# Patient Record
Sex: Female | Born: 2005 | Race: White | Hispanic: No | Marital: Single | Smoking: Never smoker
Health system: Southern US, Community
[De-identification: ages and names within clinical notes are randomized; demographics above are authoritative.]

## PROBLEM LIST (undated history)

## (undated) DIAGNOSIS — I10 Essential (primary) hypertension: Secondary | ICD-10-CM

## (undated) DIAGNOSIS — E669 Obesity, unspecified: Secondary | ICD-10-CM

## (undated) DIAGNOSIS — T7840XA Allergy, unspecified, initial encounter: Secondary | ICD-10-CM

## (undated) HISTORY — PX: NO PAST SURGERIES: SHX2092

## (undated) HISTORY — DX: Allergy, unspecified, initial encounter: T78.40XA

## (undated) HISTORY — DX: Obesity, unspecified: E66.9

## (undated) HISTORY — PX: WISDOM TOOTH EXTRACTION: SHX21

---
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
## 2023-02-07 ENCOUNTER — Telehealth: Payer: Self-pay

## 2023-02-07 NOTE — Telephone Encounter (Signed)
LVM, also sending mychart msg. AS, CMA 

## 2023-05-23 ENCOUNTER — Encounter: Payer: Self-pay | Admitting: *Deleted

## 2023-05-31 ENCOUNTER — Emergency Department (HOSPITAL_COMMUNITY)
Admission: EM | Admit: 2023-05-31 | Discharge: 2023-05-31 | Disposition: A | Discharge status: Home / Self Care | Payer: Medicaid Other | Attending: Emergency Medicine | Admitting: Emergency Medicine

## 2023-05-31 ENCOUNTER — Other Ambulatory Visit: Payer: Self-pay

## 2023-05-31 ENCOUNTER — Emergency Department (HOSPITAL_COMMUNITY): Payer: Medicaid Other

## 2023-05-31 ENCOUNTER — Encounter (HOSPITAL_COMMUNITY): Payer: Self-pay

## 2023-05-31 DIAGNOSIS — Z79899 Other long term (current) drug therapy: Secondary | ICD-10-CM | POA: Insufficient documentation

## 2023-05-31 DIAGNOSIS — N3000 Acute cystitis without hematuria: Secondary | ICD-10-CM | POA: Insufficient documentation

## 2023-05-31 DIAGNOSIS — R1031 Right lower quadrant pain: Secondary | ICD-10-CM | POA: Diagnosis present

## 2023-05-31 LAB — URINALYSIS, ROUTINE W REFLEX MICROSCOPIC
Bilirubin Urine: NEGATIVE
Glucose, UA: NEGATIVE mg/dL
Hgb urine dipstick: NEGATIVE
Ketones, ur: NEGATIVE mg/dL
Nitrite: NEGATIVE
Protein, ur: NEGATIVE mg/dL
Specific Gravity, Urine: 1.023 (ref 1.005–1.030)
pH: 5 (ref 5.0–8.0)

## 2023-05-31 LAB — CBC
HCT: 43.7 % (ref 36.0–46.0)
Hemoglobin: 14.1 g/dL (ref 12.0–15.0)
MCH: 27.1 pg (ref 26.0–34.0)
MCHC: 32.3 g/dL (ref 30.0–36.0)
MCV: 83.9 fL (ref 80.0–100.0)
Platelets: 347 10*3/uL (ref 150–400)
RBC: 5.21 MIL/uL — ABNORMAL HIGH (ref 3.87–5.11)
RDW: 13.9 % (ref 11.5–15.5)
WBC: 9.2 10*3/uL (ref 4.0–10.5)
nRBC: 0 % (ref 0.0–0.2)

## 2023-05-31 LAB — COMPREHENSIVE METABOLIC PANEL
ALT: 28 U/L (ref 0–44)
AST: 23 U/L (ref 15–41)
Albumin: 3.9 g/dL (ref 3.5–5.0)
Alkaline Phosphatase: 67 U/L (ref 38–126)
Anion gap: 12 (ref 5–15)
BUN: 12 mg/dL (ref 6–20)
CO2: 22 mmol/L (ref 22–32)
Calcium: 9 mg/dL (ref 8.9–10.3)
Chloride: 104 mmol/L (ref 98–111)
Creatinine, Ser: 0.79 mg/dL (ref 0.44–1.00)
GFR, Estimated: >60 mL/min (ref >60)
Glucose, Bld: 94 mg/dL (ref 70–99)
Potassium: 3.8 mmol/L (ref 3.5–5.1)
Sodium: 138 mmol/L (ref 135–145)
Total Bilirubin: 0.6 mg/dL (ref 0.3–1.2)
Total Protein: 7.6 g/dL (ref 6.5–8.1)

## 2023-05-31 LAB — LIPASE, BLOOD: Lipase: 27 U/L (ref 11–51)

## 2023-05-31 LAB — PREGNANCY, URINE: Preg Test, Ur: NEGATIVE

## 2023-05-31 MED ORDER — CEPHALEXIN 500 MG PO CAPS
500.0000 mg | ORAL_CAPSULE | Freq: Four times a day (QID) | ORAL | 0 refills | Status: AC
Start: 2023-05-31 — End: 2023-06-07

## 2023-05-31 MED ORDER — CEPHALEXIN 500 MG PO CAPS
500.0000 mg | ORAL_CAPSULE | Freq: Once | ORAL | Status: AC
  Administered 2023-05-31: 500 mg via ORAL
  Filled 2023-05-31: qty 1

## 2023-05-31 NOTE — ED Provider Triage Note (Signed)
Emergency Medicine Provider Triage Evaluation Note  Hayley Blankenship , a 17 y.o. female  was evaluated in triage.  Pt complains of lower quadrant abdominal pain and low back pain, has been being treated for UTI without success by PCP.  Also complains of a week and a half of sharp left upper chest pain, recently started phentermine this with the pain seemed to start..  Review of Systems  Positive: Abdominal pain, low back pain Negative: Fever, vomiting  Physical Exam  BP 134/73 (BP Location: Right Arm)   Pulse 96   Temp 98.7 F (37.1 C) (Oral)   Resp 18   Ht 6' (1.829 m)   Wt (!) 152 kg   LMP 05/08/2023 (Exact Date)   SpO2 95%   BMI 45.43 kg/m  Gen:   Awake, no distress   Resp:  Normal effort  MSK:   Moves extremities without difficulty  Other:    Medical Decision Making  Medically screening exam initiated at 3:38 PM.  Appropriate orders placed.  Hayley Blankenship was informed that the remainder of the evaluation will be completed by another provider, this initial triage assessment does not replace that evaluation, and the importance of remaining in the ED until their evaluation is complete.     Ma Rings, [place] 05/31/23 1539

## 2023-05-31 NOTE — ED Triage Notes (Signed)
Pt states having a UTI for over 2 months and seeing her PCP for it with no relief. Pt has been put on nitrofurantoin monohydrate/microcrystal capsule 2 times daily, and bactrim 800 mg with no relief. Pt states lower abdominal cramping that radiates to primarily to lt side.

## 2023-05-31 NOTE — ED Provider Notes (Signed)
 [place] [facility] AT [facility] Provider Note   CSN: [id number] Arrival date & time: 05/31/23  1430     History Chief Complaint  Patient presents with   Abdominal Pain    Hayley Blankenship is a 17 y.o. female.  Patient presents the emergency department concerns of abdominal pain.  Reports that she has been diagnosed with a UTI that has been difficult to clear over the last 2 months.  Patient has been on 2 separate rounds of antibiotics without significant improvement.  States that she is feeling lower abdominal cramping and pain with some radiation to the left side.  Denies any vaginal discharge or concerns about sexually transmitted infections.  No prior history of kidney stones.   Abdominal Pain      Home Medications Prior to Admission medications   Medication Sig Start Date End Date Taking? Authorizing Provider  cephALEXin (KEFLEX) 500 MG capsule Take 1 capsule (500 mg total) by mouth 4 (four) times daily for 7 days. 05/31/23 06/07/23 Yes Smitty Knudsen, PA-C  amoxicillin (AMOXIL) 875 MG tablet Take 1 tablet (875 mg total) by mouth 2 (two) times daily. 08/16/22   Particia Nearing, PA-C  cetirizine (ZYRTEC) 10 MG tablet Take one tablet once a day for allergies. Will need yearly check up scheduled for any more refills. Patient not taking: Reported on 06/26/2022 08/14/21   Rosiland Oz, MD  FLUoxetine (PROZAC) 10 MG capsule TAKE ONE CAPSULE BY MOUTH DAILY 12/03/22   Cyril Mourning A, NP  hydrochlorothiazide (MICROZIDE) 12.5 MG capsule Take 1 capsule (12.5 mg total) by mouth daily. 07/24/22   Adline Potter, NP  Norethindrone-Ethinyl Estradiol-Fe Biphas (LO LOESTRIN FE) 1 MG-10 MCG / 10 MCG tablet Take 1 tablet by mouth daily. 07/24/22   Adline Potter, NP      Allergies    Patient has no known allergies.    Review of Systems   Review of Systems  Gastrointestinal:  Positive for abdominal pain.  All other systems reviewed and are  negative.   Physical Exam Updated Vital Signs BP (!) 126/47   Pulse 80   Temp 98.3 F (36.8 C) (Oral)   Resp 16   Ht 6' (1.829 m)   Wt (!) 152 kg   LMP 05/08/2023 (Exact Date)   SpO2 98%   BMI 45.43 kg/m  Physical Exam Vitals and nursing note reviewed.  Constitutional:      General: She is not in acute distress.    Appearance: She is well-developed.  HENT:     Head: Normocephalic and atraumatic.  Eyes:     Conjunctiva/sclera: Conjunctivae normal.  Cardiovascular:     Rate and Rhythm: Normal rate and regular rhythm.     Heart sounds: No murmur heard. Pulmonary:     Effort: Pulmonary effort is normal. No respiratory distress.     Breath sounds: Normal breath sounds.  Abdominal:     Palpations: Abdomen is soft.     Tenderness: There is abdominal tenderness in the suprapubic area. There is right CVA tenderness and left CVA tenderness.  Musculoskeletal:        General: No swelling.     Cervical back: Neck supple.  Skin:    General: Skin is warm and dry.     Capillary Refill: Capillary refill takes less than 2 seconds.  Neurological:     Mental Status: She is alert.  Psychiatric:        Mood and Affect: Mood normal.  ED Results / Procedures / Treatments   Labs (all labs ordered are listed, but only abnormal results are displayed) Labs Reviewed  CBC - Abnormal; Notable for the following components:      Result Value   RBC 5.21 (*)    All other components within normal limits  URINALYSIS, ROUTINE W REFLEX MICROSCOPIC - Abnormal; Notable for the following components:   APPearance HAZY (*)    Leukocytes,Ua SMALL (*)    Bacteria, UA RARE (*)    All other components within normal limits  URINE CULTURE  LIPASE, BLOOD  COMPREHENSIVE METABOLIC PANEL  PREGNANCY, URINE    EKG None  Radiology CT Renal Stone Study  Result Date: 05/31/2023 CLINICAL DATA:  Lower quadrant abdominal pain, back pain, urinary tract infection EXAM: CT ABDOMEN AND PELVIS WITHOUT  CONTRAST TECHNIQUE: Multidetector CT imaging of the abdomen and pelvis was performed following the standard protocol without IV contrast. RADIATION DOSE REDUCTION: This exam was performed according to the departmental dose-optimization program which includes automated exposure control, adjustment of the mA and/or kV according to patient size and/or use of iterative reconstruction technique. COMPARISON:  None Available. FINDINGS: Lower chest: No acute abnormality. Hepatobiliary: No focal liver abnormality is seen. No gallstones, gallbladder wall thickening, or biliary dilatation. Pancreas: Unremarkable Spleen: Unremarkable Adrenals/Urinary Tract: Adrenal glands are unremarkable. Kidneys are normal, without renal calculi, focal lesion, or hydronephrosis. Bladder is unremarkable. Stomach/Bowel: Stomach is within normal limits. Appendix appears normal. No evidence of bowel wall thickening, distention, or inflammatory changes. Vascular/Lymphatic: No significant vascular findings are present. No enlarged abdominal or pelvic lymph nodes. Reproductive: Uterus and bilateral adnexa are unremarkable. Other: No abdominal wall hernia or abnormality. No abdominopelvic ascites. Musculoskeletal: No acute or significant osseous findings. IMPRESSION: 1. No acute intra-abdominal pathology identified. No definite radiographic explanation for the patient's reported symptoms. Electronically Signed   By: Helyn Numbers M.D.   On: 05/31/2023 22:45    Procedures Procedures   Medications Ordered in ED Medications  cephALEXin (KEFLEX) capsule 500 mg (500 mg Oral Given 05/31/23 2300)    ED Course/ Medical Decision Making/ A&P                               Medical Decision Making Amount and/or Complexity of Data Reviewed Labs: ordered. Radiology: ordered.   This patient presents to the ED for concern of abdominal pain.  Differential diagnosis includes pyelonephritis, acute cystitis, bowel obstruction, nephrolithiasis   Lab  Tests:  I Ordered, and personally interpreted labs.  The pertinent results include: CBC unremarkable, CMP unremarkable, UA with signs of possible infection with small leukocytes and rare bacteria present, urine pregnancy negative, lipase unremarkable, urine culture pending   Imaging Studies ordered:  I ordered imaging studies including CT renal stone study I independently visualized and interpreted imaging which showed for any acute abnormality to account for symptoms I agree with the radiologist interpretation   Medicines ordered and prescription drug management:  I ordered medication including Keflex for UTI Reevaluation of the patient after these medicines showed that the patient stayed the same I have reviewed the patients home medicines and have made adjustments as needed   Problem List / ED Course:  Patient presents to the emergency department concerns of abdominal pain.  Reports has been ongoing for the last few months that she has been diagnosed with a UTI and treated with Macrobid and Bactrim.  Reports that UTI continues to come back and has  been seen by her primary care provider for this.  Denies any prior history of interstitial cystitis or other acute abnormality to account for symptoms.  She is currently sexually active but denies any concerns for STIs.  Feels the pain radiates into her left abdomen but on examination, patient has bilateral CVA tenderness without any acute abdominal pain.  Some mild suprapubic tenderness on palpation. Labs are otherwise unremarkable CBC without acute findings to indicate infection or anemia, CMP unremarkable with no acute findings of renal insufficiency, urine pregnancy is negative, lipase unremarkable, and UA with possible signs of infection as there are leukocytes and rare bacteria present.  Urine culture currently pending. CT renal stone study ordered for evaluation of symptoms given CVA tenderness and urinalysis findings.  CT renal was  negative for urolithiasis, nephrolithiasis, or any other acute abnormalities to explain current symptoms.  Suspect patient likely does have UTI recurrence again as leukocytes and rare bacteria seen on UA.  Will treat with Keflex with urine culture pending.  Advised patient to follow-up with primary care partner for repeat evaluation and if symptoms persist, she may benefit from urologic evaluation.  Patient agreeable with current treatment plan verbalized understanding all return precautions.  All questions answered prior to patient discharge.  Patient discharged home in stable condition.  Final Clinical Impression(s) / ED Diagnoses Final diagnoses:  Acute cystitis without hematuria    Rx / DC Orders ED Discharge Orders          Ordered    cephALEXin (KEFLEX) 500 MG capsule  4 times daily        05/31/23 2256              Smitty Knudsen, PA-C 05/31/23 2341    Bethann Berkshire, MD 06/01/23 1348

## 2023-05-31 NOTE — Discharge Instructions (Signed)
You are seen in the emergency department for concerns of abdominal pain.  Your labs show signs of a possible UTI.  This was sent off for culture to determine the specific bacteria that may be causing this.  Your CT scan was negative for any signs of abdominal abnormality such as a kidney stone or other cause of your pain. Prescription for Keflex to her pharmacy to take for the next several days.  As discussed, your urine culture may cause a modification of this antibiotic if we Keflex is not the right antibiotic for you.  Please follow-up with your primary care provider for repeat evaluation.

## 2023-06-02 LAB — URINE CULTURE
Culture: NO GROWTH
Special Requests: NORMAL

## 2023-07-30 ENCOUNTER — Other Ambulatory Visit (HOSPITAL_COMMUNITY)
Admission: RE | Admit: 2023-07-30 | Discharge: 2023-07-30 | Disposition: A | Discharge status: Home / Self Care | Payer: Medicaid Other | Source: Ambulatory Visit | Attending: Obstetrics & Gynecology | Admitting: Obstetrics & Gynecology

## 2023-07-30 ENCOUNTER — Other Ambulatory Visit (INDEPENDENT_AMBULATORY_CARE_PROVIDER_SITE_OTHER): Payer: Medicaid Other

## 2023-07-30 DIAGNOSIS — Z113 Encounter for screening for infections with a predominantly sexual mode of transmission: Secondary | ICD-10-CM

## 2023-07-30 DIAGNOSIS — R339 Retention of urine, unspecified: Secondary | ICD-10-CM | POA: Diagnosis not present

## 2023-07-30 DIAGNOSIS — R8290 Unspecified abnormal findings in urine: Secondary | ICD-10-CM | POA: Diagnosis not present

## 2023-07-30 DIAGNOSIS — N898 Other specified noninflammatory disorders of vagina: Secondary | ICD-10-CM | POA: Insufficient documentation

## 2023-07-30 LAB — POCT URINALYSIS DIPSTICK OB
Blood, UA: NEGATIVE
Glucose, UA: NEGATIVE
Ketones, UA: NEGATIVE
Nitrite, UA: NEGATIVE
POC,PROTEIN,UA: NEGATIVE

## 2023-07-30 NOTE — Progress Notes (Signed)
   NURSE VISIT- UTI SYMPTOMS   SUBJECTIVE:  Hayley Blankenship is a 17 y.o. G41P1001 female here for UTI symptoms. She is a GYN patient. She reports urinary retention and urine odor . Also has vaginal discharge that started week.  OBJECTIVE:  There were no vitals taken for this visit.  Appears well, in no apparent distress  Results for orders placed or performed in visit on 07/30/23 (from the past 24 hour(s))  POC Urinalysis Dipstick OB   Collection Time: 07/30/23  9:02 AM  Result Value Ref Range   Color, UA     Clarity, UA     Glucose, UA Negative Negative   Bilirubin, UA     Ketones, UA neg    Spec Grav, UA     Blood, UA neg    pH, UA     POC,PROTEIN,UA Negative Negative, Trace, Small (1+), Moderate (2+), Large (3+), 4+   Urobilinogen, UA     Nitrite, UA neg    Leukocytes, UA Trace (A) Negative   Appearance     Odor      ASSESSMENT: GYN patient with UTI symptoms and negative nitrites Vaginal discharge  PLAN: Note routed to Cyril Mourning, AGNP   Rx sent by provider today: No Urine culture sent CV swab sent for  Call or return to clinic prn if these symptoms worsen or fail to improve as anticipated. Follow-up: as needed   Jobe Marker  07/30/2023 9:02 AM

## 2023-07-31 LAB — MICROSCOPIC EXAMINATION
Casts: NONE SEEN /[LPF]
Epithelial Cells (non renal): >10 /[HPF] — AB (ref 0–10)

## 2023-07-31 LAB — URINALYSIS, ROUTINE W REFLEX MICROSCOPIC
Bilirubin, UA: NEGATIVE
Glucose, UA: NEGATIVE
Ketones, UA: NEGATIVE
Nitrite, UA: NEGATIVE
Protein,UA: NEGATIVE
RBC, UA: NEGATIVE
Specific Gravity, UA: 1.024 (ref 1.005–1.030)
Urobilinogen, Ur: 0.2 mg/dL (ref 0.2–1.0)
pH, UA: 5.5 (ref 5.0–7.5)

## 2023-07-31 LAB — CERVICOVAGINAL ANCILLARY ONLY
Bacterial Vaginitis (gardnerella): POSITIVE — AB
Candida Glabrata: NEGATIVE
Candida Vaginitis: NEGATIVE
Chlamydia: NEGATIVE
Comment: NEGATIVE
Comment: NEGATIVE
Comment: NEGATIVE
Comment: NEGATIVE
Comment: NEGATIVE
Comment: NORMAL
Neisseria Gonorrhea: NEGATIVE
Trichomonas: NEGATIVE

## 2023-08-01 ENCOUNTER — Other Ambulatory Visit: Payer: Self-pay | Admitting: Adult Health

## 2023-08-01 LAB — URINE CULTURE

## 2023-08-01 MED ORDER — METRONIDAZOLE 500 MG PO TABS: 500.0000 mg | ORAL_TABLET | Freq: Two times a day (BID) | ORAL | 0 refills | Status: DC

## 2023-10-22 ENCOUNTER — Encounter: Payer: Self-pay | Admitting: Adult Health

## 2023-10-22 ENCOUNTER — Other Ambulatory Visit (HOSPITAL_COMMUNITY)
Admission: RE | Admit: 2023-10-22 | Discharge: 2023-10-22 | Disposition: A | Discharge status: Home / Self Care | Payer: Medicaid Other | Source: Ambulatory Visit | Attending: Adult Health | Admitting: Adult Health

## 2023-10-22 ENCOUNTER — Ambulatory Visit (INDEPENDENT_AMBULATORY_CARE_PROVIDER_SITE_OTHER): Payer: Medicaid Other | Admitting: Adult Health

## 2023-10-22 VITALS — BP 139/89 | HR 112 | Ht 72.0 in | Wt 347.0 lb

## 2023-10-22 DIAGNOSIS — Z113 Encounter for screening for infections with a predominantly sexual mode of transmission: Secondary | ICD-10-CM | POA: Diagnosis not present

## 2023-10-22 DIAGNOSIS — N911 Secondary amenorrhea: Secondary | ICD-10-CM

## 2023-10-22 DIAGNOSIS — Z3202 Encounter for pregnancy test, result negative: Secondary | ICD-10-CM | POA: Diagnosis not present

## 2023-10-22 LAB — POCT URINE PREGNANCY: Preg Test, Ur: NEGATIVE

## 2023-10-22 MED ORDER — MEDROXYPROGESTERONE ACETATE 10 MG PO TABS: ORAL_TABLET | ORAL | 0 refills | Status: DC

## 2023-10-22 NOTE — Progress Notes (Signed)
  Subjective:     Patient ID: Hayley Blankenship, female   DOB: 2006-01-27, 18 y.o.   MRN: [id number]  HPI Hayley Blankenship is a 18 year old white female, single, G1P1001, in complaining of no period since September 2024, had never skipped this many periods before, except when pregnant. She had labs 08/26/23 at [facility], A1c 5.4, insulin level 19.9, TSH 1.905  PCP is [facility]  Review of Systems No period since September No sex in 3-4 months  Reviewed past medical,surgical, social and family history. Reviewed medications and allergies.     Objective:   Physical Exam BP 139/89 (BP Location: Left Arm, Patient Position: Sitting, Cuff Size: Normal)   Pulse (!) 112   Ht 6' (1.829 m)   Wt (!) 347 lb (157.4 kg)   BMI 47.06 kg/m  UPT is negative Skin warm and dry.Pelvic: external genitalia is normal in appearance no lesions, vagina: scant white discharge without odor,urethra has no lesions or masses noted, cervix:smooth, uterus: normal size, shape and contour, non tender, no masses felt, adnexa: no masses or tenderness noted. Bladder is non tender and no masses felt. CV swab obtained.   Fall risk is low  Upstream - 10/22/23 1626       Pregnancy Intention Screening   Does the patient want to become pregnant in the next year? No    Does the patient's partner want to become pregnant in the next year? No    Would the patient like to discuss contraceptive options today? No      Contraception Wrap Up   Current Method Abstinence    End Method Abstinence            Examination chaperoned by Malachy Mood LPN  Assessment:     1. Pregnancy examination or test, negative result  - POCT urine pregnancy  2. Amenorrhea, secondary (Primary) No period since September 2024 Will rx provera 10 mg 1 daily for 10 days to see if can get withdrawal bleed Meds ordered this encounter  Medications   medroxyPROGESTERone (PROVERA) 10 MG tablet    Sig: Take 1 daily for 10 days    Dispense:  10 tablet     Refill:  0    Supervising Provider:   Duane Lope H [[id number]]     3. Screening examination for STD (sexually transmitted disease) CV swab sent for gC/CHL and trich - Cervicovaginal ancillary only( [place])     Plan:     Follow up in 3 weeks for ROS

## 2023-10-24 LAB — CERVICOVAGINAL ANCILLARY ONLY
Chlamydia: NEGATIVE
Comment: NEGATIVE
Comment: NEGATIVE
Comment: NORMAL
Neisseria Gonorrhea: NEGATIVE
Trichomonas: NEGATIVE

## 2023-11-12 ENCOUNTER — Ambulatory Visit: Payer: Medicaid Other | Admitting: Adult Health

## 2023-11-12 ENCOUNTER — Encounter: Payer: Self-pay | Admitting: Adult Health

## 2023-11-12 VITALS — BP 124/81 | HR 78 | Ht 72.0 in | Wt 347.0 lb

## 2023-11-12 DIAGNOSIS — N911 Secondary amenorrhea: Secondary | ICD-10-CM | POA: Diagnosis not present

## 2023-11-12 DIAGNOSIS — Z7689 Persons encountering health services in other specified circumstances: Secondary | ICD-10-CM | POA: Diagnosis not present

## 2023-11-12 MED ORDER — LO LOESTRIN FE 1 MG-10 MCG / 10 MCG PO TABS: 1.0000 | ORAL_TABLET | Freq: Every day | ORAL | 11 refills | Status: DC

## 2023-11-12 NOTE — Progress Notes (Signed)
  Subjective:     Patient ID: Hayley Blankenship, female   DOB: 10/08/2005, 18 y.o.   MRN: [id number]  HPI Hayley Blankenship is a 18 year old white female, G1P1001 back in follow up on taking provera and did have a period.  PCP is [facility]  Review of Systems Had period after provera Not having sex  Reviewed past medical,surgical, social and family history. Reviewed medications and allergies.     Objective:   Physical Exam BP 124/81 (BP Location: Right Arm, Patient Position: Sitting, Cuff Size: Large)   Pulse 78   Ht 6' (1.829 m)   Wt (!) 347 lb (157.4 kg)   LMP 11/01/2023   BMI 47.06 kg/m     Skin warm and dry.  Lungs: clear to ausculation bilaterally. Cardiovascular: regular rate and rhythm.   Upstream - 11/12/23 1622       Pregnancy Intention Screening   Does the patient want to become pregnant in the next year? No    Does the patient's partner want to become pregnant in the next year? No    Would the patient like to discuss contraceptive options today? No      Contraception Wrap Up   Current Method Abstinence    End Method Abstinence    Contraception Counseling Provided No             Assessment:     1. Amenorrhea, secondary Had period after provera   2. Encounter for menstrual regulation (Primary) She wants to start BCP to help cycle periods Will try lo Loestrin. Can start now  Meds ordered this encounter  Medications   Norethindrone-Ethinyl Estradiol-Fe Biphas (LO LOESTRIN FE) 1 MG-10 MCG / 10 MCG tablet    Sig: Take 1 tablet by mouth daily. Take 1 daily by mouth    Dispense:  28 tablet    Refill:  11    BIN [id number], PCN [id number], GRP [id number],OA [id number]    Supervising Provider:   Lazaro Arms [[id number]]        Plan:     Follow up with 3 months for ROS

## 2023-12-09 ENCOUNTER — Ambulatory Visit: Admitting: Podiatry

## 2024-01-21 IMAGING — DX DG CHEST 2V
2 series · 2 of 2 positions shown · non-contrast
Comparison: 07/11/2020

CLINICAL DATA: Short of breath, cough, dizziness, fever

EXAM:
CHEST - 2 VIEW

[chest pa]
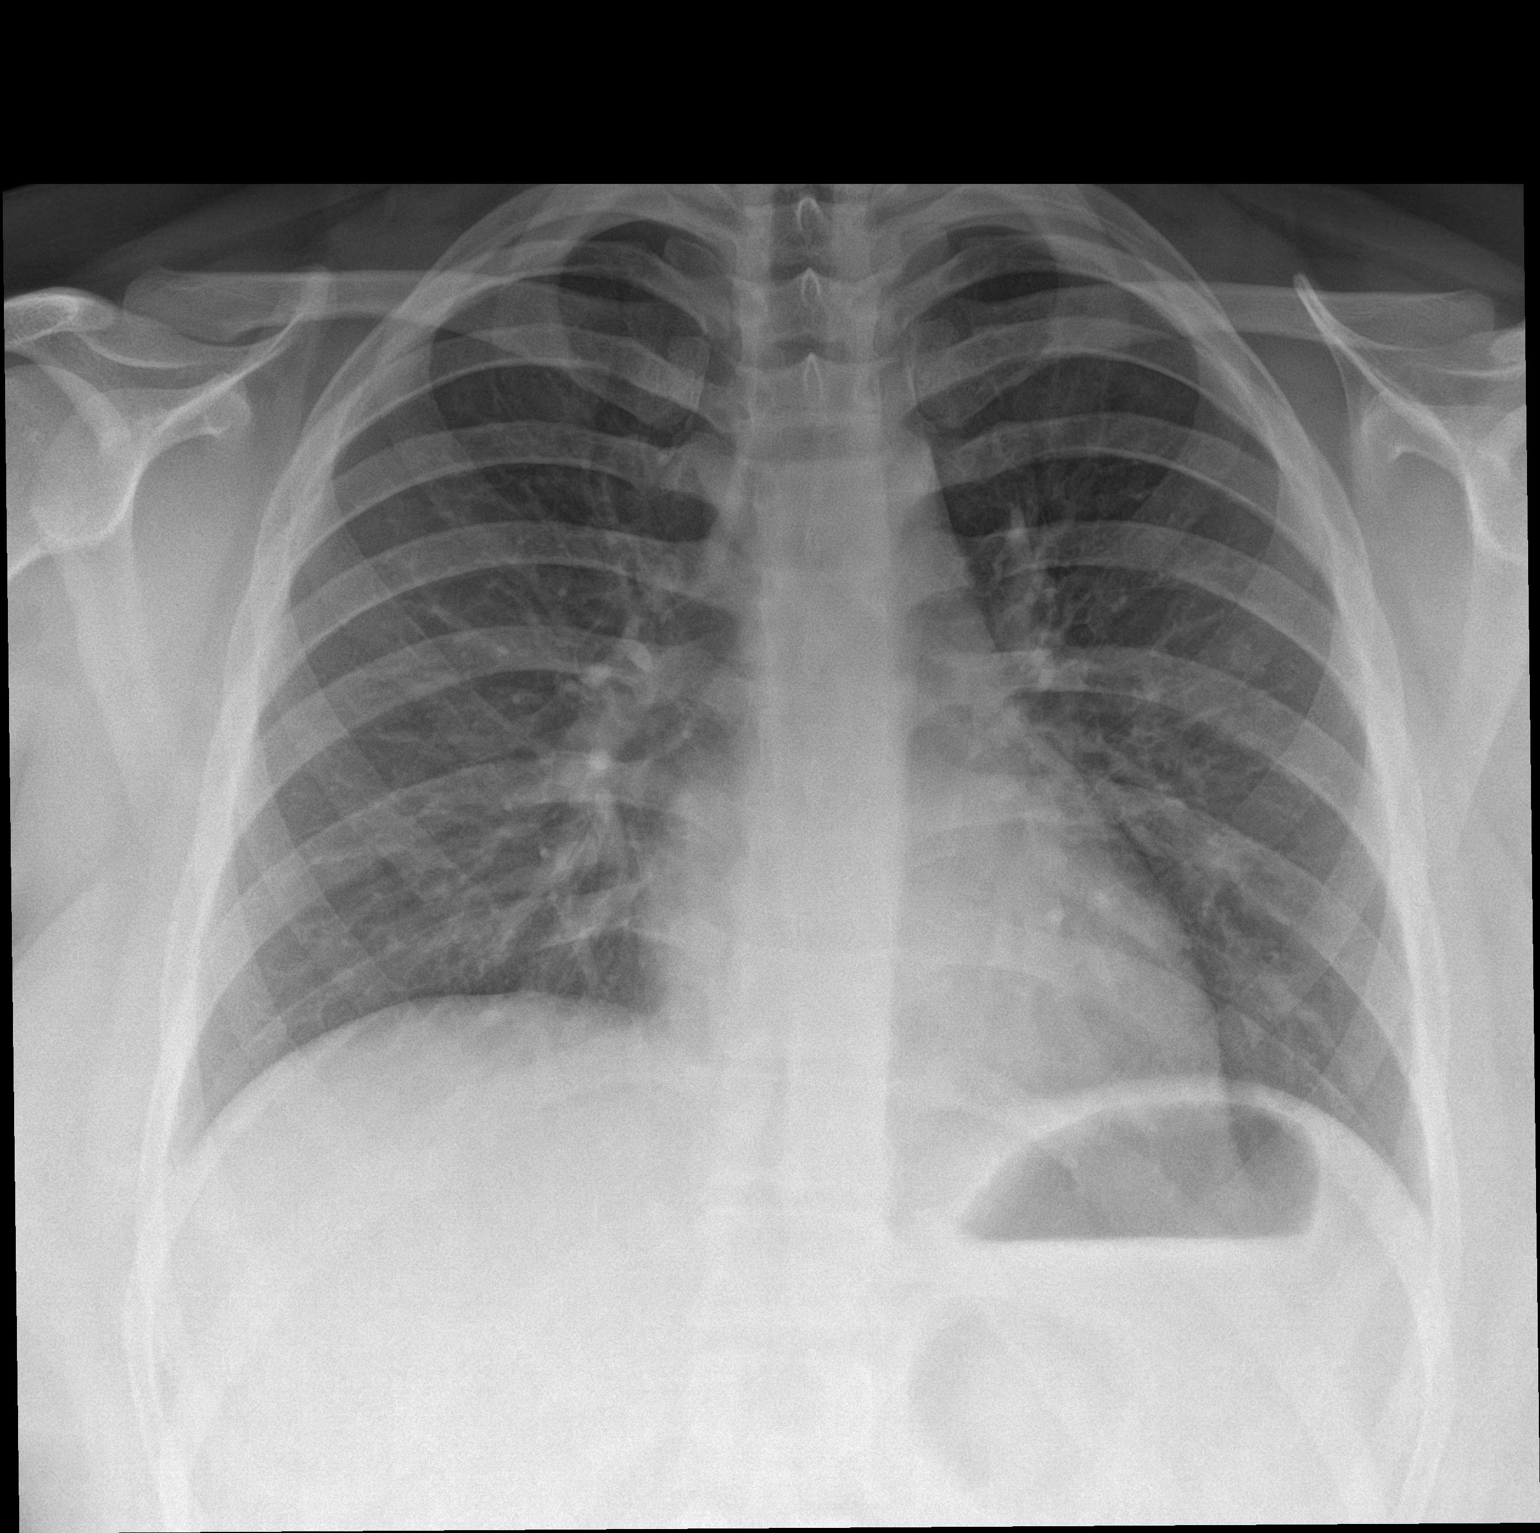

[chest lat]
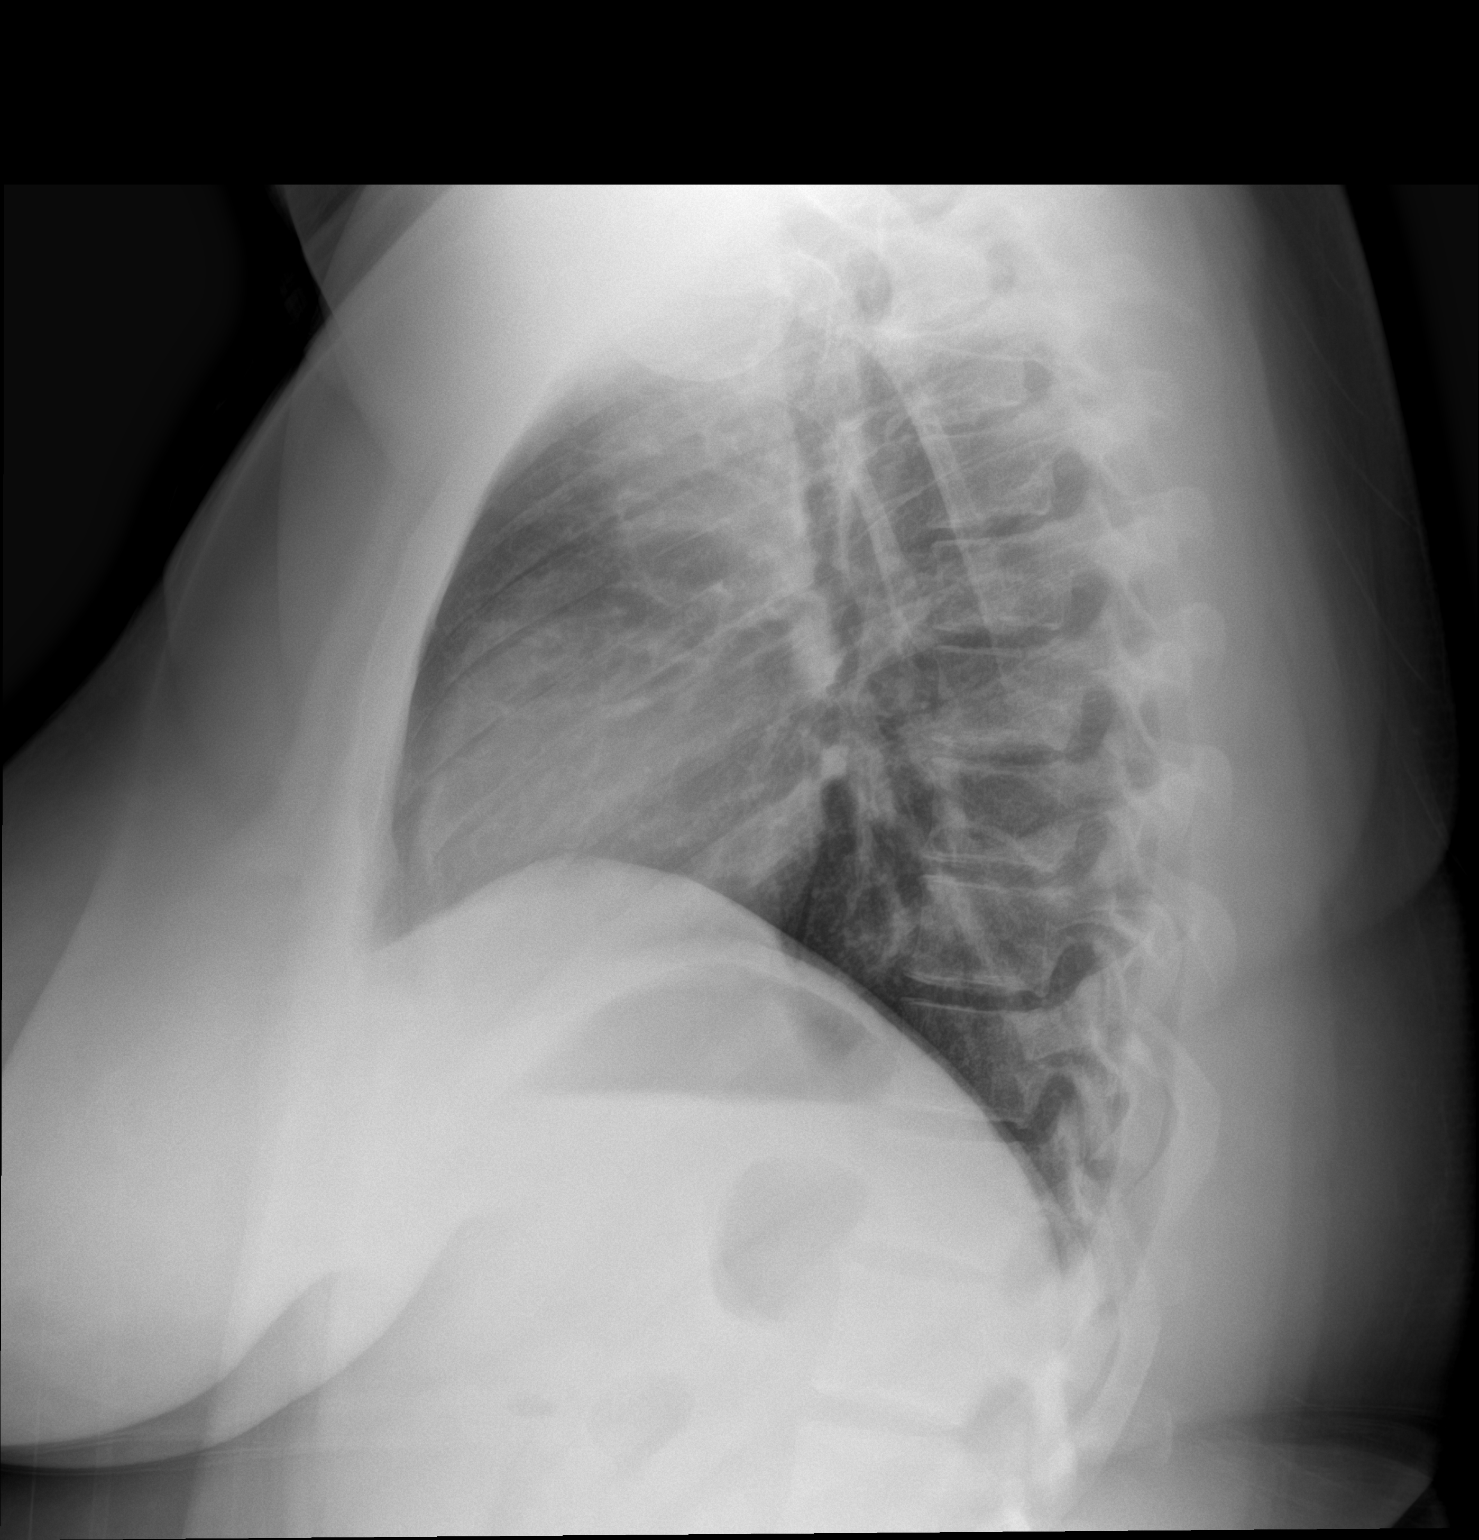

[2 of 2 positions shown; findings below may reference images not displayed]

FINDINGS: The heart size and mediastinal contours are within normal limits.
Both lungs are clear. The visualized skeletal structures are
unremarkable.
IMPRESSION: No active cardiopulmonary disease.

## 2024-02-12 ENCOUNTER — Ambulatory Visit: Admitting: Adult Health

## 2024-02-18 ENCOUNTER — Encounter: Payer: Self-pay | Admitting: Adult Health

## 2024-02-18 ENCOUNTER — Ambulatory Visit: Admitting: Adult Health

## 2024-02-18 VITALS — BP 129/78 | HR 89 | Ht 70.0 in | Wt 341.0 lb

## 2024-02-18 DIAGNOSIS — N926 Irregular menstruation, unspecified: Secondary | ICD-10-CM

## 2024-02-18 DIAGNOSIS — Z3202 Encounter for pregnancy test, result negative: Secondary | ICD-10-CM

## 2024-02-18 DIAGNOSIS — Z7689 Persons encountering health services in other specified circumstances: Secondary | ICD-10-CM

## 2024-02-18 LAB — POCT URINE PREGNANCY: Preg Test, Ur: NEGATIVE

## 2024-02-18 MED ORDER — NORETHIN ACE-ETH ESTRAD-FE 1-20 MG-MCG(24) PO TABS: 1.0000 | ORAL_TABLET | Freq: Every day | ORAL | 11 refills | Status: DC

## 2024-02-18 NOTE — Progress Notes (Signed)
  Subjective:     Patient ID: Hayley Blankenship, female   DOB: 10-30-2005, 18 y.o.   MRN: [id number]  HPI [name] is a 18 year old white female,single, G1P1001 back in follow up in starting lo Loestrin  in March after getting period with provera , she has not had period on lo Loestrin  and has been off them for a week, had oral surgery.   PCP is [facility].   Review of Systems No period on lo Loestrin , she would like to see a period Reviewed past medical,surgical, social and family history. Reviewed medications and allergies.     Objective:   Physical Exam BP 129/78 (BP Location: Right Arm, Patient Position: Sitting, Cuff Size: Large)   Pulse 89   Ht 5\' 10"  (1.778 m)   Wt (!) 341 lb (154.7 kg)   BMI 48.93 kg/m  UPT is negative Skin warm and dry. Lungs: clear to ausculation bilaterally. Cardiovascular: regular rate and rhythm.      Upstream - 02/18/24 1418       Pregnancy Intention Screening   Does the patient want to become pregnant in the next year? No    Does the patient's partner want to become pregnant in the next year? No    Would the patient like to discuss contraceptive options today? No      Contraception Wrap Up   Current Method Oral Contraceptive;No Contraceptive Precautions    End Method Oral Contraceptive    Contraception Counseling Provided No             Assessment:     1. Encounter for menstrual regulation (Primary) Can start Loestrin  1-20 tonight Meds ordered this encounter  Medications   Norethindrone Acetate-Ethinyl Estrad-FE (LOESTRIN  24 FE) 1-20 MG-MCG(24) tablet    Sig: Take 1 tablet by mouth daily.    Dispense:  28 tablet    Refill:  11    Supervising Provider:   Evalyn Hillier H [[id number]]     2. Irregular periods UPT is negative Will change BCP to Loestrin  1-20 - POCT urine pregnancy  3. Pregnancy examination or test, negative result - POCT urine pregnancy     Plan:     Follow up in 3 months for ROS

## 2024-04-06 ENCOUNTER — Encounter (HOSPITAL_COMMUNITY): Payer: Self-pay

## 2024-04-15 ENCOUNTER — Ambulatory Visit: Admitting: Adult Health

## 2024-05-20 ENCOUNTER — Ambulatory Visit: Admitting: Adult Health

## 2024-05-29 ENCOUNTER — Encounter: Payer: Self-pay | Admitting: *Deleted

## 2024-08-20 ENCOUNTER — Ambulatory Visit

## 2024-09-16 ENCOUNTER — Ambulatory Visit

## 2024-09-24 ENCOUNTER — Ambulatory Visit
Admission: RE | Admit: 2024-09-24 | Discharge: 2024-09-24 | Disposition: A | Discharge status: Home / Self Care | Attending: Family Medicine | Admitting: Family Medicine

## 2024-09-24 VITALS — BP 119/70 | HR 86 | Temp 98.5°F | Resp 16

## 2024-09-24 DIAGNOSIS — Z79899 Other long term (current) drug therapy: Secondary | ICD-10-CM | POA: Insufficient documentation

## 2024-09-24 DIAGNOSIS — Z202 Contact with and (suspected) exposure to infections with a predominantly sexual mode of transmission: Secondary | ICD-10-CM | POA: Insufficient documentation

## 2024-09-24 DIAGNOSIS — F419 Anxiety disorder, unspecified: Secondary | ICD-10-CM | POA: Diagnosis not present

## 2024-09-24 DIAGNOSIS — Z113 Encounter for screening for infections with a predominantly sexual mode of transmission: Secondary | ICD-10-CM | POA: Diagnosis not present

## 2024-09-24 DIAGNOSIS — N760 Acute vaginitis: Secondary | ICD-10-CM | POA: Diagnosis not present

## 2024-09-24 DIAGNOSIS — Z793 Long term (current) use of hormonal contraceptives: Secondary | ICD-10-CM | POA: Diagnosis not present

## 2024-09-24 DIAGNOSIS — F32A Depression, unspecified: Secondary | ICD-10-CM | POA: Diagnosis not present

## 2024-09-24 LAB — POCT URINE DIPSTICK
Bilirubin, UA: NEGATIVE
Blood, UA: NEGATIVE
Glucose, UA: NEGATIVE mg/dL
Ketones, POC UA: NEGATIVE mg/dL
Nitrite, UA: NEGATIVE
POC PROTEIN,UA: NEGATIVE
Spec Grav, UA: 1.025
Urobilinogen, UA: 0.2 U/dL
pH, UA: 6

## 2024-09-24 NOTE — Discharge Instructions (Signed)
 We have sent out a vaginal swab, urine culture and HIV and syphilis testing.  Will let you know if anything comes back abnormal and treat based on these results.
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
## 2024-09-24 NOTE — ED Triage Notes (Signed)
 Pt reports she has vaginal discharge, odor, low abdominal pain, and frequent urination x 2 days   Reports sexual activity is present

## 2024-09-25 LAB — CERVICOVAGINAL ANCILLARY ONLY
Bacterial Vaginitis (gardnerella): NEGATIVE
Candida Glabrata: NEGATIVE
Candida Vaginitis: NEGATIVE
Chlamydia: NEGATIVE
Comment: NEGATIVE
Comment: NEGATIVE
Comment: NEGATIVE
Comment: NEGATIVE
Comment: NEGATIVE
Comment: NORMAL
Neisseria Gonorrhea: NEGATIVE
Trichomonas: NEGATIVE

## 2024-09-25 LAB — HIV ANTIBODY (ROUTINE TESTING W REFLEX): HIV Screen 4th Generation wRfx: NONREACTIVE

## 2024-09-25 LAB — SYPHILIS: RPR W/REFLEX TO RPR TITER AND TREPONEMAL ANTIBODIES, TRADITIONAL SCREENING AND DIAGNOSIS ALGORITHM: RPR Ser Ql: NONREACTIVE

## 2024-09-26 LAB — URINE CULTURE

## 2024-09-28 ENCOUNTER — Ambulatory Visit (HOSPITAL_COMMUNITY): Payer: Self-pay

## 2024-10-02 ENCOUNTER — Ambulatory Visit
Admission: RE | Admit: 2024-10-02 | Discharge: 2024-10-02 | Disposition: A | Discharge status: Home / Self Care | Attending: Physician Assistant | Admitting: Physician Assistant

## 2024-10-02 VITALS — BP 104/73 | HR 100 | Temp 98.1°F | Resp 16

## 2024-10-02 DIAGNOSIS — N3000 Acute cystitis without hematuria: Secondary | ICD-10-CM

## 2024-10-02 LAB — POCT URINE DIPSTICK
Blood, UA: NEGATIVE
Glucose, UA: NEGATIVE mg/dL
Nitrite, UA: NEGATIVE
Protein Ur, POC: 30 mg/dL — AB
Spec Grav, UA: >=1.03 — AB
Urobilinogen, UA: 0.2 U/dL
pH, UA: 6

## 2024-10-02 MED ORDER — CEPHALEXIN 500 MG PO CAPS: 500.0000 mg | ORAL_CAPSULE | Freq: Four times a day (QID) | ORAL | 0 refills | Status: AC

## 2024-10-02 NOTE — Discharge Instructions (Addendum)
 Return if any problems.
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
## 2024-10-02 NOTE — ED Triage Notes (Signed)
 Was seen on 1/15 for UTI symptoms.  Urine culture was done and suggested recollection.  States she continues to have urinary urgency and discomfort when urinating.  Symptoms x 1 week.

## 2024-10-03 LAB — URINE CULTURE: Special Requests: NORMAL

## 2024-10-07 ENCOUNTER — Ambulatory Visit: Payer: Self-pay

## 2024-10-12 ENCOUNTER — Ambulatory Visit

## 2024-10-12 ENCOUNTER — Telehealth: Payer: Self-pay

## 2024-10-12 MED ORDER — FLUCONAZOLE 150 MG PO TABS: 150.0000 mg | ORAL_TABLET | Freq: Every day | ORAL | 0 refills | Status: AC

## 2024-11-20 ENCOUNTER — Ambulatory Visit: Admitting: Adult Health
